# Patient Record
Sex: Female | Born: 1955 | Race: White | Hispanic: No | Marital: Married | Smoking: Current every day smoker
Health system: Southern US, Community
[De-identification: ages and names within clinical notes are randomized; demographics above are authoritative.]

## PROBLEM LIST (undated history)

## (undated) DIAGNOSIS — T4145XA Adverse effect of unspecified anesthetic, initial encounter: Secondary | ICD-10-CM

## (undated) DIAGNOSIS — R200 Anesthesia of skin: Secondary | ICD-10-CM

## (undated) DIAGNOSIS — Z8489 Family history of other specified conditions: Secondary | ICD-10-CM

## (undated) DIAGNOSIS — F32A Depression, unspecified: Secondary | ICD-10-CM

## (undated) DIAGNOSIS — N200 Calculus of kidney: Secondary | ICD-10-CM

## (undated) DIAGNOSIS — F419 Anxiety disorder, unspecified: Secondary | ICD-10-CM

## (undated) DIAGNOSIS — F329 Major depressive disorder, single episode, unspecified: Secondary | ICD-10-CM

## (undated) DIAGNOSIS — I10 Essential (primary) hypertension: Secondary | ICD-10-CM

## (undated) DIAGNOSIS — T8859XA Other complications of anesthesia, initial encounter: Secondary | ICD-10-CM

## (undated) DIAGNOSIS — M1990 Unspecified osteoarthritis, unspecified site: Secondary | ICD-10-CM

## (undated) HISTORY — PX: ABDOMINAL HYSTERECTOMY: SHX81

## (undated) HISTORY — PX: TUBAL LIGATION: SHX77

## (undated) HISTORY — PX: FOOT SURGERY: SHX648

---

## 1997-12-07 ENCOUNTER — Emergency Department (HOSPITAL_COMMUNITY): Admission: EM | Admit: 1997-12-07 | Discharge: 1997-12-07 | Payer: Self-pay | Admitting: Emergency Medicine

## 1998-02-16 ENCOUNTER — Other Ambulatory Visit: Admission: RE | Admit: 1998-02-16 | Discharge: 1998-02-16 | Payer: Self-pay | Admitting: Podiatry

## 1999-09-13 ENCOUNTER — Other Ambulatory Visit: Admission: RE | Admit: 1999-09-13 | Discharge: 1999-09-13 | Payer: Self-pay | Admitting: Obstetrics and Gynecology

## 2004-07-11 ENCOUNTER — Ambulatory Visit: Payer: Self-pay

## 2011-03-12 ENCOUNTER — Other Ambulatory Visit: Payer: Self-pay | Admitting: Internal Medicine

## 2011-03-12 ENCOUNTER — Ambulatory Visit
Admission: RE | Admit: 2011-03-12 | Discharge: 2011-03-12 | Disposition: A | Discharge status: Home / Self Care | Payer: PRIVATE HEALTH INSURANCE | Source: Ambulatory Visit | Attending: Internal Medicine | Admitting: Internal Medicine

## 2011-03-12 DIAGNOSIS — M25559 Pain in unspecified hip: Secondary | ICD-10-CM

## 2011-04-12 ENCOUNTER — Emergency Department (HOSPITAL_COMMUNITY)
Admission: EM | Admit: 2011-04-12 | Discharge: 2011-04-12 | Disposition: A | Discharge status: Home / Self Care | Payer: PRIVATE HEALTH INSURANCE | Attending: Emergency Medicine | Admitting: Emergency Medicine

## 2011-04-12 ENCOUNTER — Emergency Department (HOSPITAL_COMMUNITY): Payer: PRIVATE HEALTH INSURANCE

## 2011-04-12 DIAGNOSIS — I10 Essential (primary) hypertension: Secondary | ICD-10-CM | POA: Insufficient documentation

## 2011-04-12 DIAGNOSIS — M25519 Pain in unspecified shoulder: Secondary | ICD-10-CM | POA: Insufficient documentation

## 2011-10-04 ENCOUNTER — Emergency Department (INDEPENDENT_AMBULATORY_CARE_PROVIDER_SITE_OTHER)
Admission: EM | Admit: 2011-10-04 | Discharge: 2011-10-04 | Disposition: A | Discharge status: Home / Self Care | Payer: PRIVATE HEALTH INSURANCE | Source: Home / Self Care | Attending: Emergency Medicine | Admitting: Emergency Medicine

## 2011-10-04 ENCOUNTER — Encounter (HOSPITAL_COMMUNITY): Payer: Self-pay | Admitting: Emergency Medicine

## 2011-10-04 DIAGNOSIS — K1120 Sialoadenitis, unspecified: Secondary | ICD-10-CM

## 2011-10-04 HISTORY — DX: Essential (primary) hypertension: I10

## 2011-10-04 MED ORDER — PENICILLIN V POTASSIUM 500 MG PO TABS: 500.0000 mg | ORAL_TABLET | Freq: Three times a day (TID) | ORAL | Status: AC

## 2011-10-04 NOTE — ED Provider Notes (Signed)
History     CSN: [id number]  Arrival date & time 10/04/11  0909   First MD Initiated Contact with Patient 10/04/11 [phone]      Chief Complaint  Patient presents with  . Facial Swelling    (Consider location/radiation/quality/duration/timing/severity/associated sxs/prior treatment) HPI Comments: Sudden onset of R jaw swelling radiating to her R ear, " swelling was much worse a couple of hours ago, it seem to be getting better, but i got scare cause i don't know what it was, i have no teeth there and have no mouth pains, or ear infections that i know of"  Patient denies, any local rashes, facial muscular weakness or changes, no numbness or tingling  The history is provided by the patient and a relative.    Past Medical History  Diagnosis Date  . Diabetes mellitus   . Hypertension     Past Surgical History  Procedure Date  . Abdominal hysterectomy   . Foot surgery     No family history on file.  History  Substance Use Topics  . Smoking status: Current Everyday Smoker  . Smokeless tobacco: Not on file  . Alcohol Use: No    OB History    Grav Para Term Preterm Abortions TAB SAB Ect Mult Living                  Review of Systems  Constitutional: Negative for fever, activity change, fatigue and unexpected weight change.  HENT: Positive for ear pain and facial swelling. Negative for hearing loss, congestion, neck pain, neck stiffness and tinnitus.   Eyes: Negative for pain and visual disturbance.  Cardiovascular: Negative for chest pain.  Neurological: Negative for dizziness, facial asymmetry, speech difficulty, weakness, numbness and headaches.    Allergies  Codeine  Home Medications   Current Outpatient Rx  Name Route Sig Dispense Refill  . ATENOLOL 25 MG PO TABS Oral Take 25 mg by mouth daily.    . CHLORTHALIDONE 25 MG PO TABS Oral Take 25 mg by mouth daily.    Marland Kitchen GLIPIZIDE ER 5 MG PO TB24 Oral Take 2.5 mg by mouth daily.    Marland Kitchen HYDROCODONE-ACETAMINOPHEN 5-500  MG PO TABS Oral Take 1 tablet by mouth every 6 (six) hours as needed.    Marland Kitchen PENICILLIN V POTASSIUM 500 MG PO TABS Oral Take 1 tablet (500 mg total) by mouth 3 (three) times daily. 40 tablet 0    BP 137/88  Pulse 61  Temp(Src) 98.3 F (36.8 C) (Oral)  Resp 14  SpO2 98%  Physical Exam  Nursing note and vitals reviewed. Constitutional: She appears well-developed.  Non-toxic appearance. She does not have a sickly appearance. She does not appear ill. No distress.  HENT:  Head: Normocephalic and atraumatic.    Right Ear: Tympanic membrane and external ear normal.  Left Ear: Tympanic membrane and external ear normal.  Mouth/Throat: No oropharyngeal exudate.  Neck: Neck supple. No JVD present. No thyromegaly present.  Lymphadenopathy:    She has no cervical adenopathy.  Skin: No rash noted.    ED Course  Procedures (including critical care time)  Labs Reviewed - No data to display No results found.   1. Sialoadenitis of submandibular gland       MDM  INTERMITTENT SIALOADENITIS LEFT SUBMANDIBULAR GLAND- FOLLOW-UP AND CLOSE MONITORING RECOMMENDED        Jimmie Molly, MD 10/04/11 1757

## 2011-10-04 NOTE — ED Notes (Signed)
PT HERE WITH SUDDEN RIGHT JAW SELLING RADIATING TO R EAR WITH TIGHTNESS BUT NO PAIN OR NUMB/TINGLING.STARTED THIS MORNING WHILE WATCHING TV.PT STATES THE SWELLING GOES UP/DOWN.NO NEW MEDS OR FOODS REPORTED.

## 2011-10-04 NOTE — Discharge Instructions (Signed)
As discussed be alert for any skin changes or further symptoms in the meantime we discuss several things to do along with his antibiotic prescription that is mainly prevent his as is uncertain if this is infectious in nature try to increase her hydration and salivation as discussed.   Sialadenitis Sialadenitis is an inflammation (soreness) of the salivary glands. The parotid is the main salivary gland. It lies behind the angle of the jaw below the ear. The saliva produced comes out of a tiny opening (duct) inside the cheek on either side. This is usually at the level of the upper back teeth. If it is swollen, the ear is pushed up and out. This helps tell this condition apart from a simple lymph gland infection (swollen glands) in the same area. Mumps has mostly disappeared since the start of immunization against mumps. Now the most common cause of parotitis is germ (bacterial) infection or inflammation of the lymphatics (the lymph channels). The other major salivary gland is located in the floor of the mouth. Smaller salivary glands are located in the mouth. This includes the:  Lips.   Lining of the mouth.   Pharynx.   Hard palate (front part of the roof of the mouth).  The salivary glands do many things, including:  Lubrication.   Breaking down food.   Production of hormones and antibodies (to protect against germs which may cause illness).   Help with the sense of taste.  ACUTE BACTERIAL SIALADENITIS This is a sudden inflammatory response to bacterial infection. This causes redness, pain, swelling and tenderness over the infected gland. In the past, it was common in dehydrated and debilitated patients often following an operation. It is now more commonly seen:  After radiotherapy.   In patients with poor immune systems.  Treatment is:  The correction of fluid balance (rehydration).   Medicine that kill germs (antibiotics).   Pain relief.  CHRONIC RECURRENT SIALADENITIS This  refers to repeated episodes of discomfort and swelling of one of the salivary glands. It often occurs after eating. Chronic sialadenitis is usually less painful. It is associated with recurrent enlargement of a salivary gland, often following meals, and typically with an absence of redness. The chronic form of the disease often is associated with conditions linked to decreased salivary flow, rather than dehydration (loss of body fluids). These conditions include:  A stone, or concretion, formed in the gallbladder, kidneys, or other parts of the body (calculi).   Salivary stasis.   A change in the fluid and electrolyte (the salts in your body fluids) makeup of the gland.  It is treated with:  Gland massage.   Methods to stimulate the flow of saliva, (for example, lemon juice).   Antibiotics if required.  Surgery to remove the gland is possible, but its benefits need to be balanced against risks.  VIRAL SIALADENITIS Several viruses infect the salivary glands. Some of these include the mumps virus that commonly infects the parotid gland. Other viruses causing problems are:  The HIV virus.   Herpes.   Some of the influenza ("flu") viruses.  RECURRENT SIALADENITIS IN CHILDREN This condition is thought to be due to swelling or ballooning of the ducts. It results in the same symptoms as acute bacterial parotitis. It is usually caused by germs (bacteria). It is often treated using penicillin. It may get well without treatment. Surgery is usually not required. TUBERCULOUS SIALADENITIS The salivary glands may become infected with the same bacteria causing tuberculosis ("TB"). Treatment is with anti-tuberculous  antibiotic therapy. OTHER UNCOMMON CAUSES OF SIALADENITIS   Sjogren's syndrome is a condition in which arthritis is associated with a decrease in activity of the glands of the body that produce saliva and tears. The diagnosis is made with blood tests or by examination of a piece of tissue  from the inside of the lip. Some people with this condition are bothered by:   A dry mouth.   Intermittent salivary gland enlargement.   Atypical mycobacteria is a germ similar to tuberculosis. It often infects children. It is often resistant to antibiotic treatment. It may require surgical treatment to remove the infected salivary gland.   Actinomycosis is an infection of the parotid gland that may also involve the overlying skin. The diagnosis is made by detecting granules of sulphur produced by the bacteria on microscopic examination. Treatment is a prolonged course of penicillin for up to one year.   Nutritional causes include vitamin deficiencies and bulimia.   Diabetes and problems with your thyroid.   Obesity, cirrhosis, and malabsorption are some metabolic causes.  HOME CARE INSTRUCTIONS   Apply ice bags every 2 hours for 15 to 20 minutes, while awake, to the sore gland for 24 hours, then as directed by your caregiver. Place the ice in a plastic bag with a towel around it to prevent frostbite to the skin.   Only take over-the-counter or prescription medicines for pain, discomfort, or fever as directed by your caregiver.  SEEK IMMEDIATE MEDICAL CARE IF:   There is increased pain or swelling in your gland that is not controlled with medicine.   An oral temperature above 102 F (38.9 C) develops, not controlled by medicine.   You develop difficulty opening your mouth, swallowing, or speaking.  Document Released: 11/23/2001 Document Revised: 05/23/2011 Document Reviewed: 01/18/2008 [facility] Patient Information 2012 [place], [place].

## 2011-12-22 ENCOUNTER — Emergency Department (HOSPITAL_COMMUNITY)
Admission: EM | Admit: 2011-12-22 | Discharge: 2011-12-22 | Discharge status: Against Medical Advice | Payer: Self-pay | Attending: Emergency Medicine | Admitting: Emergency Medicine

## 2011-12-22 ENCOUNTER — Encounter (HOSPITAL_COMMUNITY): Payer: Self-pay | Admitting: Emergency Medicine

## 2011-12-22 DIAGNOSIS — R059 Cough, unspecified: Secondary | ICD-10-CM | POA: Insufficient documentation

## 2011-12-22 DIAGNOSIS — R05 Cough: Secondary | ICD-10-CM | POA: Insufficient documentation

## 2011-12-22 NOTE — ED Notes (Signed)
Pt has decided to leave pt screening by MD

## 2011-12-22 NOTE — ED Notes (Signed)
Pt alert, nad, arrives from home, c/o sore throat and cough, onset several days ago resp even unlabored, skin pwd

## 2012-08-18 IMAGING — CR DG SHOULDER 2+V*L*
3 series · 3 of 3 positions shown · non-contrast
Comparison: None.

CLINICAL DATA: A

LEFT SHOULDER - 2+ VIEW

[w shoulder internal left]
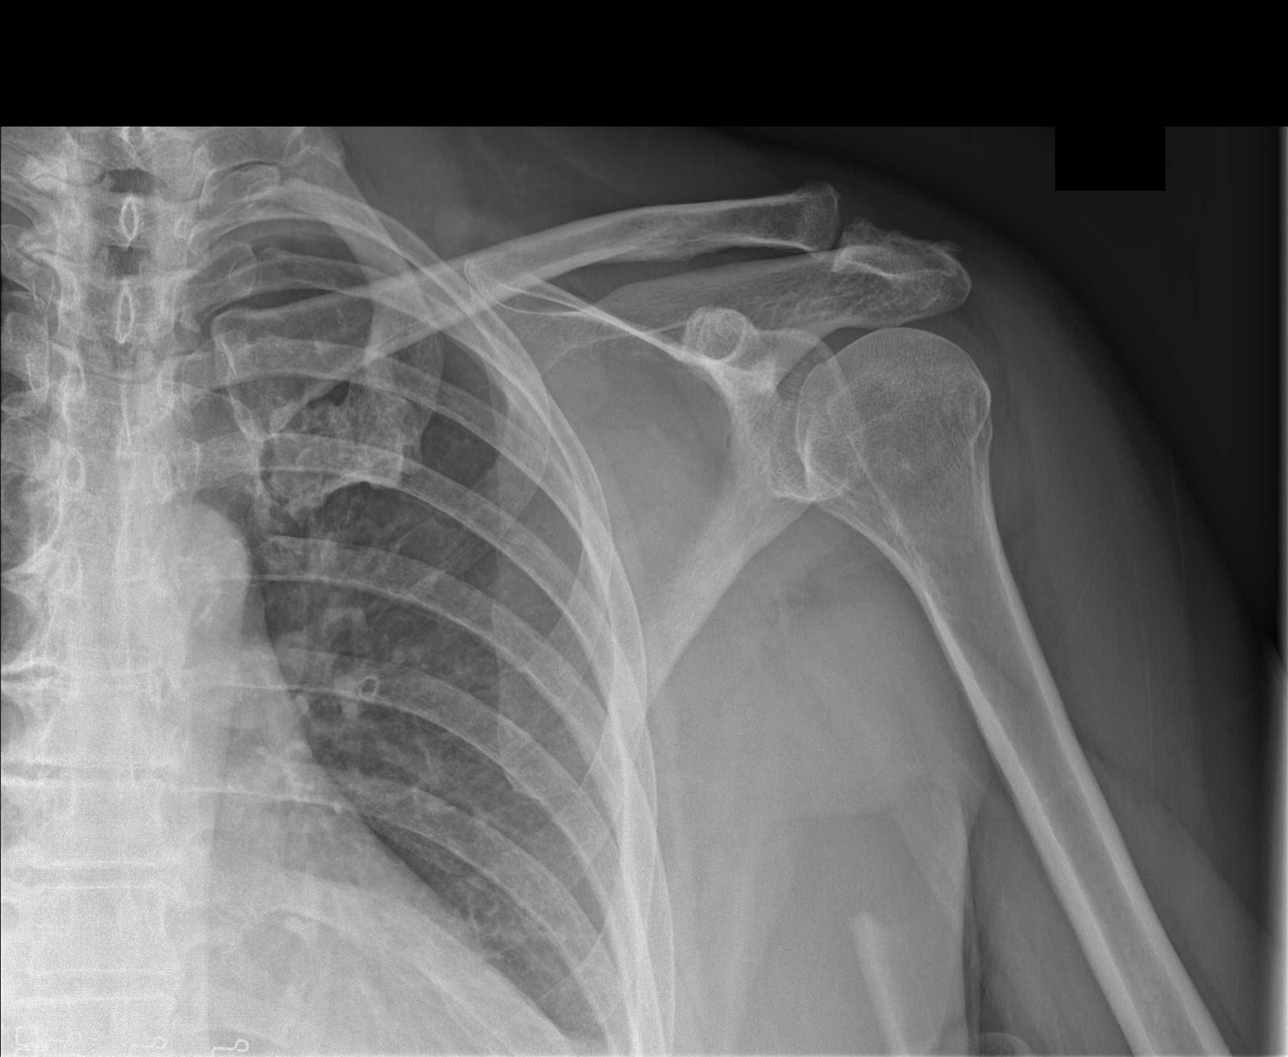

[w shoulder external left]
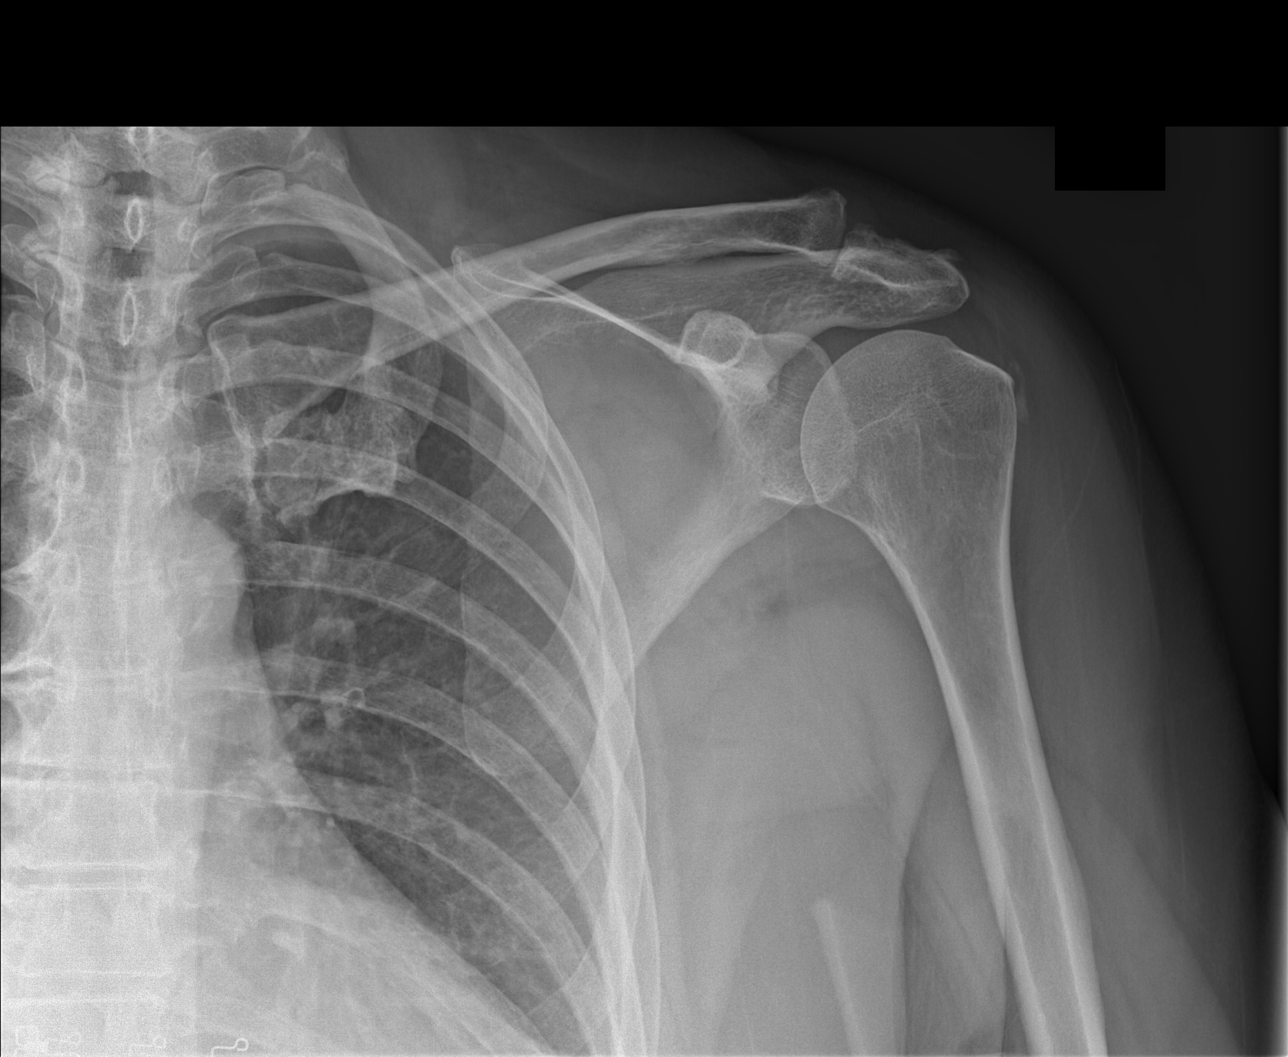

[w shoulder y-view left]
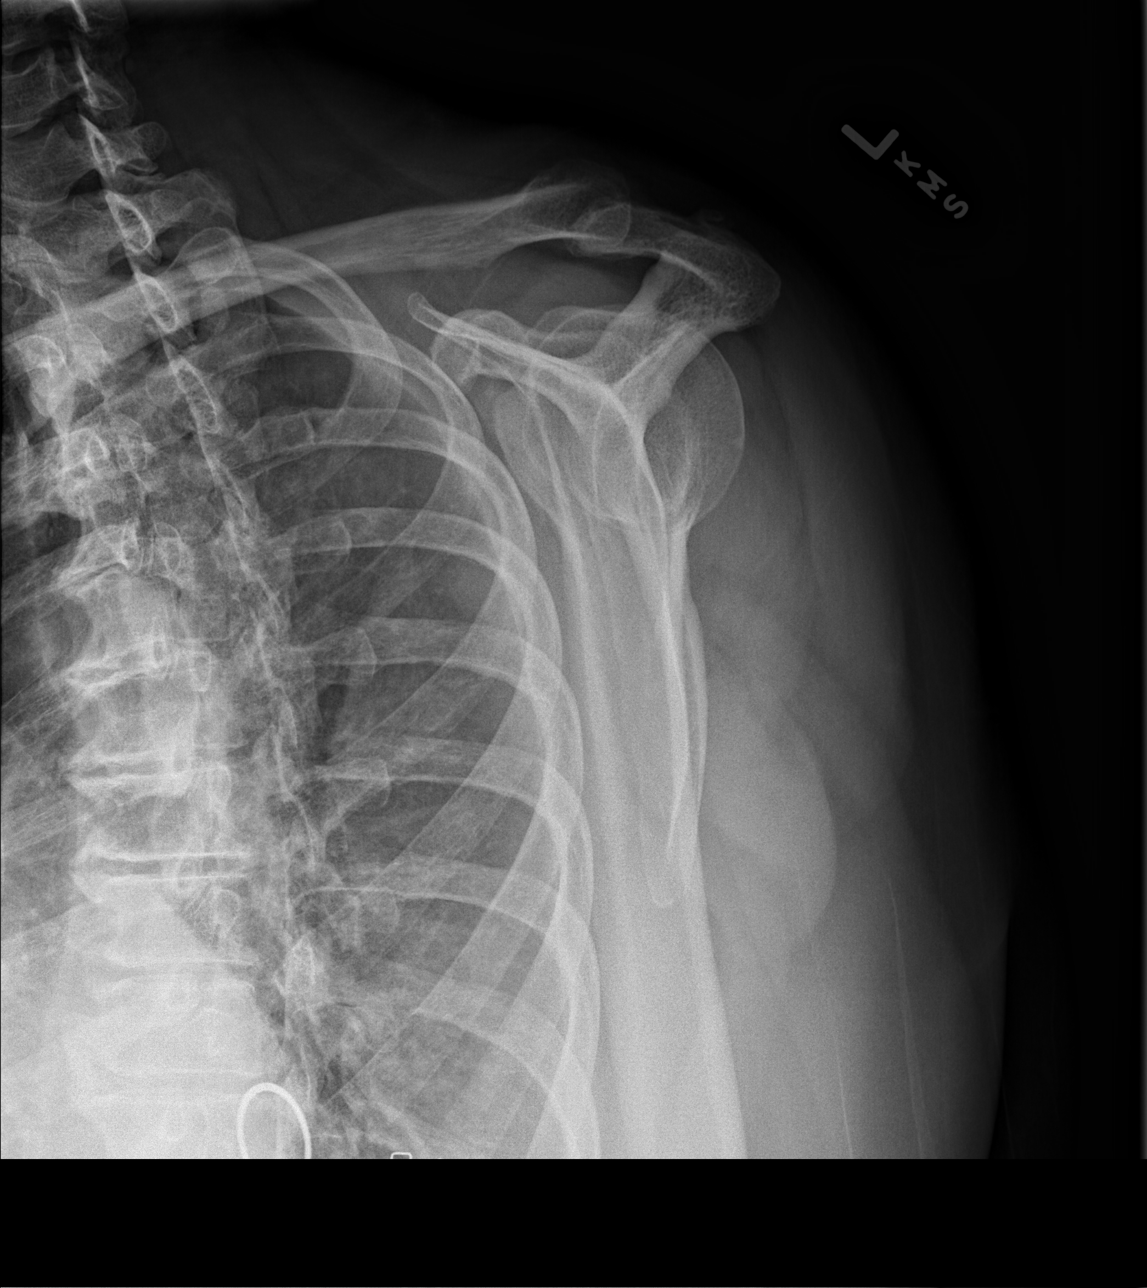

[3 of 3 positions shown; findings below may reference images not displayed]

FINDINGS: No fracture.  No evidence for shoulder separation or
dislocation.  There is some degenerative change at the AC joint.
IMPRESSION: No acute bony findings.

## 2013-05-26 ENCOUNTER — Encounter (INDEPENDENT_AMBULATORY_CARE_PROVIDER_SITE_OTHER): Payer: Self-pay | Admitting: General Surgery

## 2013-05-26 ENCOUNTER — Encounter (INDEPENDENT_AMBULATORY_CARE_PROVIDER_SITE_OTHER): Payer: Self-pay

## 2013-05-26 ENCOUNTER — Ambulatory Visit (INDEPENDENT_AMBULATORY_CARE_PROVIDER_SITE_OTHER): Payer: PRIVATE HEALTH INSURANCE | Admitting: General Surgery

## 2013-05-26 VITALS — BP 121/79 | HR 76 | Temp 98.1°F | Resp 14 | Ht 67.0 in | Wt 208.4 lb

## 2013-05-26 DIAGNOSIS — K8020 Calculus of gallbladder without cholecystitis without obstruction: Secondary | ICD-10-CM

## 2013-05-26 NOTE — Progress Notes (Signed)
Patient ID: Sherri Stanley, female   DOB: [birth year], 58 y.o.   MRN: [id number]  Chief Complaint  Patient presents with  . Abdominal Pain    gallbladder    HPI Sherri Stanley is a 57 y.o. female.  The patient is a 57 year old female with approximately one-week history of epigastric abdominal pain. Patient presented to the [facility] ER and was diagnosed with biliary colic large gallstone in the neck of her gallbladder. Patient states as was her initial presentation his never had pain like this before. She  Has had some small subsequent events since then.  HPI  Past Medical History  Diagnosis Date  . Diabetes mellitus   . Hypertension     Past Surgical History  Procedure Laterality Date  . Abdominal hysterectomy    . Foot surgery      History reviewed. No pertinent family history.  Social History History  Substance Use Topics  . Smoking status: Current Every Day Smoker -- 1.00 packs/day    Types: Cigarettes  . Smokeless tobacco: Not on file  . Alcohol Use: No    Allergies  Allergen Reactions  . Codeine     Current Outpatient Prescriptions  Medication Sig Dispense Refill  . atenolol (TENORMIN) 25 MG tablet Take 25 mg by mouth daily.      . chlorthalidone (HYGROTON) 25 MG tablet Take 25 mg by mouth daily.      Marland Kitchen glipiZIDE (GLUCOTROL XL) 5 MG 24 hr tablet Take 2.5 mg by mouth daily.      Marland Kitchen HYDROcodone-acetaminophen (VICODIN) 5-500 MG per tablet Take 1 tablet by mouth every 6 (six) hours as needed.       No current facility-administered medications for this visit.    Review of Systems Review of Systems  Constitutional: Negative.   HENT: Negative.   Respiratory: Negative.   Cardiovascular: Negative.   Gastrointestinal: Positive for abdominal pain.  Neurological: Negative.   All other systems reviewed and are negative.    Blood pressure 121/79, pulse 76, temperature 98.1 F (36.7 C), temperature source Temporal, resp. rate 14, height 5\' 7"  (1.702 m), weight 208 lb 6.4  oz (94.53 kg).  Physical Exam Physical Exam  Constitutional: She is oriented to person, place, and time. She appears well-developed and well-nourished.  HENT:  Head: Normocephalic and atraumatic.  Eyes: Conjunctivae and EOM are normal. Pupils are equal, round, and reactive to light.  Neck: Normal range of motion. Neck supple.  Cardiovascular: Normal rate, regular rhythm and normal heart sounds.   Pulmonary/Chest: Effort normal and breath sounds normal.  Abdominal: Soft. There is no tenderness. There is no rebound and no guarding.  Musculoskeletal: Normal range of motion.  Neurological: She is alert and oriented to person, place, and time.  Skin: Skin is warm and dry.  Psychiatric: She has a normal mood and affect.    Data Reviewed Ultrasound reveals a large gallstone in the neck of the gallbladder  Assessment    57 year old female with symptomatic cholelithiasis     Plan    1 we will proceed to the operating room for laparoscopic cholecystectomy 2.All risks and benefits were discussed with the patient to generally include: infection, bleeding, possible need for post op ERCP, damage to the bile ducts, and bile leak. Alternatives were offered and described.  All questions were answered and the patient voiced understanding of the procedure and wishes to proceed at this point with a laparoscopic cholecystectomy         Marigene Ehlers., Rashod Gougeon 05/26/2013, 1:25  PM

## 2013-05-27 ENCOUNTER — Encounter (HOSPITAL_COMMUNITY): Payer: Self-pay | Admitting: Pharmacy Technician

## 2013-05-27 NOTE — Pre-Procedure Instructions (Signed)
Sherri Stanley  05/27/2013   Your procedure is scheduled on:  Monday May 31, 2013 @ 12:09 pm.  Report to [facility] Short Stay Entrance "A" Admitting at 10:05 AM.  Call this number if you have problems the morning of surgery: [phone]   Remember:   Do not eat food or drink liquids after midnight.   Take these medicines the morning of surgery with A SIP OF WATER: Atenolol (Tenormin), and Hydrocodone if needed for pain  Do NOT take any diabetic medications the morning of your surgery Stop taking Aspirin, vitamins and herbal medications. Do not take any NSAIDs ie: Ibuprofen, Advil, Naproxen or any medication containing Aspirin.  Do not wear jewelry, make-up or nail polish.  Do not wear lotions, powders, or perfumes. You may wear deodorant.  Do not shave 48 hours prior to surgery.   Do not bring valuables to the hospital.  [facility] is not responsible for any belongings or valuables.               Contacts, dentures or bridgework may not be worn into surgery.  Leave suitcase in the car. After surgery it may be brought to your room.  For patients admitted to the hospital, discharge time is determined by your treatment team.               Patients discharged the day of surgery will not be allowed to drive home.  Name and phone number of your driver: Family/Friend  Special Instructions: Shower using CHG 2 nights before surgery and the night before surgery.  If you shower the day of surgery use CHG.  Use special wash - you have one bottle of CHG for all showers.  You should use approximately 1/3 of the bottle for each shower.   Please read over the following fact sheets that you were given: Pain Booklet, Coughing and Deep Breathing and Surgical Site Infection Prevention

## 2013-05-28 ENCOUNTER — Encounter (HOSPITAL_COMMUNITY)
Admission: RE | Admit: 2013-05-28 | Discharge: 2013-05-28 | Disposition: A | Discharge status: Home / Self Care | Payer: PRIVATE HEALTH INSURANCE | Source: Ambulatory Visit | Attending: General Surgery | Admitting: General Surgery

## 2013-05-28 ENCOUNTER — Encounter (HOSPITAL_COMMUNITY): Payer: Self-pay

## 2013-05-28 HISTORY — DX: Depression, unspecified: F32.A

## 2013-05-28 HISTORY — DX: Major depressive disorder, single episode, unspecified: F32.9

## 2013-05-28 HISTORY — DX: Adverse effect of unspecified anesthetic, initial encounter: T41.45XA

## 2013-05-28 HISTORY — DX: Family history of other specified conditions: Z84.89

## 2013-05-28 HISTORY — DX: Anesthesia of skin: R20.0

## 2013-05-28 HISTORY — DX: Unspecified osteoarthritis, unspecified site: M19.90

## 2013-05-28 HISTORY — DX: Other complications of anesthesia, initial encounter: T88.59XA

## 2013-05-28 HISTORY — DX: Anxiety disorder, unspecified: F41.9

## 2013-05-28 HISTORY — DX: Calculus of kidney: N20.0

## 2013-05-28 LAB — CBC
MCHC: 34.9 g/dL (ref 30.0–36.0)
Platelets: 226 10*3/uL (ref 150–400)
RDW: 13.3 % (ref 11.5–15.5)

## 2013-05-28 LAB — BASIC METABOLIC PANEL
GFR calc Af Amer: >90 mL/min (ref >90)
GFR calc non Af Amer: >90 mL/min (ref >90)
Potassium: 3.2 mEq/L — ABNORMAL LOW (ref 3.5–5.1)
Sodium: 140 mEq/L (ref 135–145)

## 2013-05-30 MED ORDER — CEFAZOLIN SODIUM-DEXTROSE 2-3 GM-% IV SOLR
2.0000 g | INTRAVENOUS | Status: AC
  Administered 2013-05-31: 2 g via INTRAVENOUS
  Filled 2013-05-30: qty 50

## 2013-05-31 ENCOUNTER — Encounter (HOSPITAL_COMMUNITY): Payer: Self-pay | Admitting: Anesthesiology

## 2013-05-31 ENCOUNTER — Encounter (HOSPITAL_COMMUNITY): Payer: PRIVATE HEALTH INSURANCE | Admitting: Anesthesiology

## 2013-05-31 ENCOUNTER — Encounter (HOSPITAL_COMMUNITY)
Admission: RE | Disposition: A | Discharge status: Home / Self Care | Payer: Self-pay | Source: Ambulatory Visit | Attending: General Surgery

## 2013-05-31 ENCOUNTER — Ambulatory Visit (HOSPITAL_COMMUNITY)
Admission: RE | Admit: 2013-05-31 | Discharge: 2013-05-31 | Disposition: A | Discharge status: Home / Self Care | Payer: PRIVATE HEALTH INSURANCE | Source: Ambulatory Visit | Attending: General Surgery | Admitting: General Surgery

## 2013-05-31 ENCOUNTER — Ambulatory Visit (HOSPITAL_COMMUNITY): Payer: PRIVATE HEALTH INSURANCE | Admitting: Anesthesiology

## 2013-05-31 DIAGNOSIS — K8010 Calculus of gallbladder with chronic cholecystitis without obstruction: Secondary | ICD-10-CM

## 2013-05-31 DIAGNOSIS — K8020 Calculus of gallbladder without cholecystitis without obstruction: Secondary | ICD-10-CM | POA: Insufficient documentation

## 2013-05-31 DIAGNOSIS — E119 Type 2 diabetes mellitus without complications: Secondary | ICD-10-CM | POA: Insufficient documentation

## 2013-05-31 DIAGNOSIS — F17200 Nicotine dependence, unspecified, uncomplicated: Secondary | ICD-10-CM | POA: Insufficient documentation

## 2013-05-31 DIAGNOSIS — Z79899 Other long term (current) drug therapy: Secondary | ICD-10-CM | POA: Insufficient documentation

## 2013-05-31 DIAGNOSIS — I10 Essential (primary) hypertension: Secondary | ICD-10-CM | POA: Insufficient documentation

## 2013-05-31 HISTORY — PX: CHOLECYSTECTOMY: SHX55

## 2013-05-31 LAB — GLUCOSE, CAPILLARY: Glucose-Capillary: 137 mg/dL — ABNORMAL HIGH (ref 70–99)

## 2013-05-31 SURGERY — LAPAROSCOPIC CHOLECYSTECTOMY
Anesthesia: General | Site: Abdomen

## 2013-05-31 MED ORDER — BUPIVACAINE HCL (PF) 0.25 % IJ SOLN
INTRAMUSCULAR | Status: AC
  Filled 2013-05-31: qty 30

## 2013-05-31 MED ORDER — LACTATED RINGERS IV SOLN
Freq: Once | INTRAVENOUS | Status: AC
  Administered 2013-05-31: 10:00:00 via INTRAVENOUS

## 2013-05-31 MED ORDER — PROPOFOL 10 MG/ML IV BOLUS
INTRAVENOUS | Status: DC | PRN
  Administered 2013-05-31: 150 mg via INTRAVENOUS

## 2013-05-31 MED ORDER — SODIUM CHLORIDE 0.9 % IR SOLN
Status: DC | PRN
  Administered 2013-05-31: 1000 mL

## 2013-05-31 MED ORDER — SODIUM CHLORIDE 0.9 % IV SOLN: 250.0000 mL | INTRAVENOUS | Status: DC | PRN

## 2013-05-31 MED ORDER — SODIUM CHLORIDE 0.9 % IJ SOLN: 3.0000 mL | Freq: Two times a day (BID) | INTRAMUSCULAR | Status: DC

## 2013-05-31 MED ORDER — LACTATED RINGERS IV SOLN
INTRAVENOUS | Status: DC | PRN
  Administered 2013-05-31 (×2): via INTRAVENOUS

## 2013-05-31 MED ORDER — SODIUM CHLORIDE 0.9 % IJ SOLN: 3.0000 mL | INTRAMUSCULAR | Status: DC | PRN

## 2013-05-31 MED ORDER — ARTIFICIAL TEARS OP OINT
TOPICAL_OINTMENT | OPHTHALMIC | Status: DC | PRN
  Administered 2013-05-31: 1 via OPHTHALMIC

## 2013-05-31 MED ORDER — ROCURONIUM BROMIDE 100 MG/10ML IV SOLN
INTRAVENOUS | Status: DC | PRN
  Administered 2013-05-31: 40 mg via INTRAVENOUS

## 2013-05-31 MED ORDER — HYDROMORPHONE HCL PF 1 MG/ML IJ SOLN
INTRAMUSCULAR | Status: AC
  Filled 2013-05-31: qty 1

## 2013-05-31 MED ORDER — TRAMADOL HCL 50 MG PO TABS: 50.0000 mg | ORAL_TABLET | Freq: Four times a day (QID) | ORAL | Status: DC | PRN

## 2013-05-31 MED ORDER — GLYCOPYRROLATE 0.2 MG/ML IJ SOLN
INTRAMUSCULAR | Status: DC | PRN
  Administered 2013-05-31: .6 mg via INTRAVENOUS

## 2013-05-31 MED ORDER — LIDOCAINE HCL (CARDIAC) 20 MG/ML IV SOLN
INTRAVENOUS | Status: DC | PRN
  Administered 2013-05-31: 100 mg via INTRAVENOUS

## 2013-05-31 MED ORDER — ACETAMINOPHEN 325 MG PO TABS: 650.0000 mg | ORAL_TABLET | ORAL | Status: DC | PRN

## 2013-05-31 MED ORDER — ONDANSETRON HCL 4 MG/2ML IJ SOLN: 4.0000 mg | Freq: Once | INTRAMUSCULAR | Status: DC | PRN

## 2013-05-31 MED ORDER — HYDROMORPHONE HCL PF 1 MG/ML IJ SOLN
0.2500 mg | INTRAMUSCULAR | Status: DC | PRN
  Administered 2013-05-31 (×2): 0.5 mg via INTRAVENOUS

## 2013-05-31 MED ORDER — ONDANSETRON HCL 4 MG/2ML IJ SOLN: 4.0000 mg | Freq: Four times a day (QID) | INTRAMUSCULAR | Status: DC | PRN

## 2013-05-31 MED ORDER — CHLORHEXIDINE GLUCONATE 4 % EX LIQD: 1.0000 "application " | Freq: Once | CUTANEOUS | Status: DC

## 2013-05-31 MED ORDER — MIDAZOLAM HCL 5 MG/5ML IJ SOLN
INTRAMUSCULAR | Status: DC | PRN
  Administered 2013-05-31: 2 mg via INTRAVENOUS

## 2013-05-31 MED ORDER — ONDANSETRON HCL 4 MG/2ML IJ SOLN
INTRAMUSCULAR | Status: DC | PRN
  Administered 2013-05-31 (×2): 4 mg via INTRAVENOUS

## 2013-05-31 MED ORDER — NEOSTIGMINE METHYLSULFATE 1 MG/ML IJ SOLN
INTRAMUSCULAR | Status: DC | PRN
  Administered 2013-05-31: 5 mg via INTRAVENOUS

## 2013-05-31 MED ORDER — ATROPINE SULFATE 0.1 MG/ML IJ SOLN
INTRAMUSCULAR | Status: AC
  Administered 2013-05-31: 0.4 mg
  Filled 2013-05-31: qty 10

## 2013-05-31 MED ORDER — BUPIVACAINE HCL 0.25 % IJ SOLN
INTRAMUSCULAR | Status: DC | PRN
  Administered 2013-05-31: 12 mL

## 2013-05-31 MED ORDER — ACETAMINOPHEN 650 MG RE SUPP: 650.0000 mg | RECTAL | Status: DC | PRN

## 2013-05-31 MED ORDER — FENTANYL CITRATE 0.05 MG/ML IJ SOLN
INTRAMUSCULAR | Status: DC | PRN
  Administered 2013-05-31: 50 ug via INTRAVENOUS
  Administered 2013-05-31: 100 ug via INTRAVENOUS
  Administered 2013-05-31: 50 ug via INTRAVENOUS

## 2013-05-31 MED ORDER — 0.9 % SODIUM CHLORIDE (POUR BTL) OPTIME
TOPICAL | Status: DC | PRN
  Administered 2013-05-31: 1000 mL

## 2013-05-31 SURGICAL SUPPLY — 43 items
APL SKNCLS STERI-STRIP NONHPOA (GAUZE/BANDAGES/DRESSINGS) ×1
BAG SPEC RTRVL LRG 6X4 10 (ENDOMECHANICALS)
BENZOIN TINCTURE PRP APPL 2/3 (GAUZE/BANDAGES/DRESSINGS) ×2 IMPLANT
CANISTER SUCTION 2500CC (MISCELLANEOUS) ×2 IMPLANT
CHLORAPREP W/TINT 26ML (MISCELLANEOUS) ×2 IMPLANT
CLIP LIGATING HEMO O LOK GREEN (MISCELLANEOUS) ×2 IMPLANT
COVER MAYO STAND STRL (DRAPES) IMPLANT
COVER SURGICAL LIGHT HANDLE (MISCELLANEOUS) ×2 IMPLANT
COVER TRANSDUCER ULTRASND (DRAPES) ×2 IMPLANT
DEVICE TROCAR PUNCTURE CLOSURE (ENDOMECHANICALS) ×2 IMPLANT
DRAPE C-ARM 42X72 X-RAY (DRAPES) IMPLANT
DRAPE UTILITY 15X26 W/TAPE STR (DRAPE) ×4 IMPLANT
ELECT REM PT RETURN 9FT ADLT (ELECTROSURGICAL) ×2
ELECTRODE REM PT RTRN 9FT ADLT (ELECTROSURGICAL) ×1 IMPLANT
GAUZE SPONGE 2X2 8PLY STRL LF (GAUZE/BANDAGES/DRESSINGS) ×1 IMPLANT
GLOVE BIO SURGEON STRL SZ7 (GLOVE) ×1 IMPLANT
GLOVE BIO SURGEON STRL SZ7.5 (GLOVE) ×2 IMPLANT
GLOVE BIOGEL PI IND STRL 7.0 (GLOVE) IMPLANT
GLOVE BIOGEL PI INDICATOR 7.0 (GLOVE) ×3
GLOVE SURG SS PI 7.0 STRL IVOR (GLOVE) ×1 IMPLANT
GOWN STRL NON-REIN LRG LVL3 (GOWN DISPOSABLE) ×5 IMPLANT
GOWN STRL REIN XL XLG (GOWN DISPOSABLE) ×2 IMPLANT
IV CATH 14GX2 1/4 (CATHETERS) IMPLANT
KIT BASIN OR (CUSTOM PROCEDURE TRAY) ×2 IMPLANT
KIT ROOM TURNOVER OR (KITS) ×2 IMPLANT
NDL INSUFFLATION 14GA 120MM (NEEDLE) ×1 IMPLANT
NEEDLE INSUFFLATION 14GA 120MM (NEEDLE) ×2 IMPLANT
NS IRRIG 1000ML POUR BTL (IV SOLUTION) ×2 IMPLANT
PAD ARMBOARD 7.5X6 YLW CONV (MISCELLANEOUS) ×4 IMPLANT
POUCH SPECIMEN RETRIEVAL 10MM (ENDOMECHANICALS) IMPLANT
SCISSORS LAP 5X35 DISP (ENDOMECHANICALS) ×2 IMPLANT
SET CHOLANGIOGRAPHY FRANKLIN (SET/KITS/TRAYS/PACK) IMPLANT
SET IRRIG TUBING LAPAROSCOPIC (IRRIGATION / IRRIGATOR) ×2 IMPLANT
SLEEVE ENDOPATH XCEL 5M (ENDOMECHANICALS) ×2 IMPLANT
SPECIMEN JAR SMALL (MISCELLANEOUS) ×2 IMPLANT
SPONGE GAUZE 2X2 STER 10/PKG (GAUZE/BANDAGES/DRESSINGS) ×1
SUT MNCRL AB 3-0 PS2 18 (SUTURE) ×2 IMPLANT
TAPE CLOTH SURG 4X10 WHT LF (GAUZE/BANDAGES/DRESSINGS) ×1 IMPLANT
TOWEL OR 17X24 6PK STRL BLUE (TOWEL DISPOSABLE) ×2 IMPLANT
TOWEL OR 17X26 10 PK STRL BLUE (TOWEL DISPOSABLE) ×2 IMPLANT
TRAY LAPAROSCOPIC (CUSTOM PROCEDURE TRAY) ×2 IMPLANT
TROCAR XCEL NON-BLD 11X100MML (ENDOMECHANICALS) ×2 IMPLANT
TROCAR XCEL NON-BLD 5MMX100MML (ENDOMECHANICALS) ×2 IMPLANT

## 2013-05-31 NOTE — Anesthesia Procedure Notes (Signed)
Procedure Name: Intubation Date/Time: 05/31/2013 12:36 PM Performed by: Sherie Don Pre-anesthesia Checklist: Patient identified, Emergency Drugs available, Suction available, Patient being monitored and Timeout performed Patient Re-evaluated:Patient Re-evaluated prior to inductionOxygen Delivery Method: Circle system utilized Preoxygenation: Pre-oxygenation with 100% oxygen Intubation Type: IV induction Ventilation: Mask ventilation without difficulty Laryngoscope Size: Mac and 3 Grade View: Grade I Tube type: Oral Tube size: 7.5 mm Number of attempts: 1 Airway Equipment and Method: Stylet Placement Confirmation: positive ETCO2,  ETT inserted through vocal cords under direct vision and breath sounds checked- equal and bilateral Secured at: 22 cm Tube secured with: Tape Dental Injury: Teeth and Oropharynx as per pre-operative assessment

## 2013-05-31 NOTE — Op Note (Signed)
05/31/2013  1:21 PM  PATIENT:  Sherri Stanley  57 y.o. female  PRE-OPERATIVE DIAGNOSIS:  gallstones  POST-OPERATIVE DIAGNOSIS:  gallstones  PROCEDURE:  Procedure(s): LAPAROSCOPIC CHOLECYSTECTOMY (N/A)  SURGEON:  Surgeon(s) and Role:    * Axel Filler, MD - Primary  PHYSICIAN ASSISTANT:   ASSISTANTS: none   ANESTHESIA:   general  EBL:  Total I/O In: 1000 [I.V.:1000] Out: -   BLOOD ADMINISTERED:none  DRAINS: none   LOCAL MEDICATIONS USED:  MARCAINE     SPECIMEN:  Source of Specimen:  Gallbladder   DISPOSITION OF SPECIMEN:  PATHOLOGY  COUNTS:  YES  TOURNIQUET:  * No tourniquets in log *  DICTATION: .Dragon Dictation Findings:Chornically inflammed gallbladder  Indications for procedure: Pt is a 57 y/o F with RUQ pain and seen to have gallstones. She was counseled in clinic and decided to have these elecitvely repaired.  Details of the procedure: The patient was taken to the operating and placed in the supine position with bilateral SCDs in place. A time out was called and all facts were verified. A pneumoperitoneum was obtained via A Veress needle technique to a pressure of 14mm of mercury. A 5mm trochar was then placed in the right upper quadrant under visualization, and there were no injuries to any abdominal organs. A 11 mm port was then placed in the umbilical region after infiltrating with local anesthesia under direct visualization. A second epigastric port was placed under direct visualization. The gallbladder was identified and retracted, the peritoneum was then sharply dissected from the gallbladder and this dissection was carried down to Calot's triangle. The cystic duct was identified and stripped away circumferentially and seen going into the gallbladder 360, the critical angle was obtained. It was noted to be very dilated and large.  2 clips were placed proximally one distally and the cystic duct transected. The cystic artery was identified and 2 clips  placed proximally and one distally and transected. We then proceeded to remove the gallbladder off the hepatic fossa with Bovie cautery. A retrieval bag was then placed in the abdomen and gallbladder placed in the bag. The hepatic fossa was then reexamined and hemostasis was achieved with Bovie cautery and was excellent at this portion of the case. The subhepatic fossa and perihepatic fossa was then irrigated until the effluent was clear. The 11 mm trocar fascia was reapproximated with the Endo Close #1 Vicryl x2. The pneumoperitoneum was evacuated and all trochars removed under direct visulalization. The skin was then closed with 4-0 Monocryl and the skin dressed with Steri-Strips, gauze, and tape. The patient was awaken from general anesthesia and taken to the recovery room in stable condition.    PLAN OF CARE: Discharge to home after PACU  PATIENT DISPOSITION:  PACU - hemodynamically stable.   Delay start of Pharmacological VTE agent (>24hrs) due to surgical blood loss or risk of bleeding: not applicable

## 2013-05-31 NOTE — Anesthesia Preprocedure Evaluation (Signed)
Anesthesia Evaluation  Patient identified by MRN, date of birth, ID band Patient awake    Reviewed: Allergy & Precautions, H&P , NPO status , Patient's Chart, lab work & pertinent test results  History of Anesthesia Complications (+) Family history of anesthesia reaction  Airway       Dental   Pulmonary Current Smoker,          Cardiovascular hypertension,     Neuro/Psych    GI/Hepatic   Endo/Other  diabetes, Type 2, Oral Hypoglycemic Agents  Renal/GU Renal disease     Musculoskeletal   Abdominal   Peds  Hematology   Anesthesia Other Findings   Reproductive/Obstetrics                           Anesthesia Physical Anesthesia Plan  ASA: III  Anesthesia Plan: General   Post-op Pain Management:    Induction: Intravenous  Airway Management Planned: Oral ETT  Additional Equipment:   Intra-op Plan:   Post-operative Plan: Extubation in OR  Informed Consent: I have reviewed the patients History and Physical, chart, labs and discussed the procedure including the risks, benefits and alternatives for the proposed anesthesia with the patient or authorized representative who has indicated his/her understanding and acceptance.     Plan Discussed with:   Anesthesia Plan Comments:         Anesthesia Quick Evaluation

## 2013-05-31 NOTE — Progress Notes (Signed)
Dr Gelene Mink aware of pt's HR in pacu. Order received to give atropine .4 mg.  He will see pt in pacu.

## 2013-05-31 NOTE — Interval H&P Note (Signed)
History and Physical Interval Note:  05/31/2013 12:22 PM  Sherri Stanley  has presented today for surgery, with the diagnosis of gallstones  The various methods of treatment have been discussed with the patient and family. After consideration of risks, benefits and other options for treatment, the patient has consented to  Procedure(s): LAPAROSCOPIC CHOLECYSTECTOMY (N/A) as a surgical intervention .  The patient's history has been reviewed, patient examined, no change in status, stable for surgery.  I have reviewed the patient's chart and labs.  Questions were answered to the patient's satisfaction.     Marigene Ehlers., Jed Limerick

## 2013-05-31 NOTE — Preoperative (Signed)
Beta Blockers   Reason not to administer Beta Blockers:received atenolol this am

## 2013-05-31 NOTE — Anesthesia Postprocedure Evaluation (Signed)
Anesthesia Post Note  Patient: Sherri Stanley  Procedure(s) Performed: Procedure(s) (LRB): LAPAROSCOPIC CHOLECYSTECTOMY (N/A)  Anesthesia type: General  Patient location: PACU  Post pain: Pain level controlled  Post assessment: Patient's Cardiovascular Status Stable  Last Vitals:  Filed Vitals:   05/31/13 1517  BP:   Pulse: 63  Temp:   Resp: 18    Post vital signs: Reviewed and stable  Level of consciousness: alert  Complications: No apparent anesthesia complications

## 2013-05-31 NOTE — H&P (View-Only) (Signed)
Patient ID: Sherri Stanley, female   DOB: 01/21/1956, 57 y.o.   MRN: [id number]  Chief Complaint  Patient presents with  . Abdominal Pain    gallbladder    HPI Sherri Stanley is a 57 y.o. female.  The patient is a 57-year-old female with approximately one-week history of epigastric abdominal pain. Patient presented to the [facility] and was diagnosed with biliary colic large gallstone in the neck of her gallbladder. Patient states as was her initial presentation his never had pain like this before. She  Has had some small subsequent events since then.  HPI  Past Medical History  Diagnosis Date  . Diabetes mellitus   . Hypertension     Past Surgical History  Procedure Laterality Date  . Abdominal hysterectomy    . Foot surgery      History reviewed. No pertinent family history.  Social History History  Substance Use Topics  . Smoking status: Current Every Day Smoker -- 1.00 packs/day    Types: Cigarettes  . Smokeless tobacco: Not on file  . Alcohol Use: No    Allergies  Allergen Reactions  . Codeine     Current Outpatient Prescriptions  Medication Sig Dispense Refill  . atenolol (TENORMIN) 25 MG tablet Take 25 mg by mouth daily.      . chlorthalidone (HYGROTON) 25 MG tablet Take 25 mg by mouth daily.      . glipiZIDE (GLUCOTROL XL) 5 MG 24 hr tablet Take 2.5 mg by mouth daily.      . HYDROcodone-acetaminophen (VICODIN) 5-500 MG per tablet Take 1 tablet by mouth every 6 (six) hours as needed.       No current facility-administered medications for this visit.    Review of Systems Review of Systems  Constitutional: Negative.   HENT: Negative.   Respiratory: Negative.   Cardiovascular: Negative.   Gastrointestinal: Positive for abdominal pain.  Neurological: Negative.   All other systems reviewed and are negative.    Blood pressure 121/79, pulse 76, temperature 98.1 F (36.7 C), temperature source Temporal, resp. rate 14, height 5' 7" (1.702 m), weight 208 lb 6.4  oz (94.53 kg).  Physical Exam Physical Exam  Constitutional: She is oriented to person, place, and time. She appears well-developed and well-nourished.  HENT:  Head: Normocephalic and atraumatic.  Eyes: Conjunctivae and EOM are normal. Pupils are equal, round, and reactive to light.  Neck: Normal range of motion. Neck supple.  Cardiovascular: Normal rate, regular rhythm and normal heart sounds.   Pulmonary/Chest: Effort normal and breath sounds normal.  Abdominal: Soft. There is no tenderness. There is no rebound and no guarding.  Musculoskeletal: Normal range of motion.  Neurological: She is alert and oriented to person, place, and time.  Skin: Skin is warm and dry.  Psychiatric: She has a normal mood and affect.    Data Reviewed Ultrasound reveals a large gallstone in the neck of the gallbladder  Assessment    57-year-old female with symptomatic cholelithiasis     Plan    1 we will proceed to the operating room for laparoscopic cholecystectomy 2.All risks and benefits were discussed with the patient to generally include: infection, bleeding, possible need for post op ERCP, damage to the bile ducts, and bile leak. Alternatives were offered and described.  All questions were answered and the patient voiced understanding of the procedure and wishes to proceed at this point with a laparoscopic cholecystectomy         Sherri Beedle Jr., Sherri Stanley 05/26/2013, 1:25   PM    

## 2013-05-31 NOTE — Transfer of Care (Signed)
Immediate Anesthesia Transfer of Care Note  Patient: Sherri Stanley  Procedure(s) Performed: Procedure(s): LAPAROSCOPIC CHOLECYSTECTOMY (N/A)  Patient Location: PACU  Anesthesia Type:General  Level of Consciousness: sedated and patient cooperative  Airway & Oxygen Therapy: Patient Spontanous Breathing and Patient connected to face mask oxygen  Post-op Assessment: Report given to PACU RN, Post -op Vital signs reviewed and stable and Patient moving all extremities X 4  Post vital signs: Reviewed and stable  Complications: No apparent anesthesia complications

## 2013-06-01 ENCOUNTER — Encounter (INDEPENDENT_AMBULATORY_CARE_PROVIDER_SITE_OTHER): Payer: Self-pay

## 2013-06-01 ENCOUNTER — Encounter (HOSPITAL_COMMUNITY): Payer: Self-pay | Admitting: General Surgery

## 2013-06-01 NOTE — Progress Notes (Signed)
Pt fell asleep and sats decreased to 79-83% on R/A.  Pt would arouse to voice but every time she fell asleep, her sats decreased to 79-83%.  Pt placed on 2L O2 by [place]. Sats increased to 98%.

## 2013-06-01 NOTE — Progress Notes (Signed)
Pt much more alert now and back to her baseline.  Ambulated to BR.  Voided large amt.  Discharge teaching completed with pt and husband who verbalize understanding.

## 2013-06-03 ENCOUNTER — Telehealth (INDEPENDENT_AMBULATORY_CARE_PROVIDER_SITE_OTHER): Payer: Self-pay | Admitting: *Deleted

## 2013-06-03 NOTE — Telephone Encounter (Signed)
Pts husband called stating that pt is constipated and it is causing some nausea.  I recommended that pt take some miralax to get some relief.  I also recommended that pt take stool softeners while taking the pain medication.  I explained that if pt continues to have nausea and no bowel movement after intervention to please call our office back.  He is agreeable with this plan.

## 2013-07-05 ENCOUNTER — Encounter (INDEPENDENT_AMBULATORY_CARE_PROVIDER_SITE_OTHER): Payer: PRIVATE HEALTH INSURANCE | Admitting: General Surgery

## 2013-07-08 ENCOUNTER — Encounter (INDEPENDENT_AMBULATORY_CARE_PROVIDER_SITE_OTHER): Payer: Self-pay | Admitting: General Surgery

## 2013-07-08 ENCOUNTER — Encounter (INDEPENDENT_AMBULATORY_CARE_PROVIDER_SITE_OTHER): Payer: Self-pay

## 2013-07-08 ENCOUNTER — Ambulatory Visit (INDEPENDENT_AMBULATORY_CARE_PROVIDER_SITE_OTHER): Payer: PRIVATE HEALTH INSURANCE | Admitting: General Surgery

## 2013-07-08 VITALS — BP 116/70 | HR 68 | Temp 97.8°F | Resp 14 | Ht 67.0 in | Wt 207.8 lb

## 2013-07-08 DIAGNOSIS — Z9889 Other specified postprocedural states: Secondary | ICD-10-CM

## 2013-07-08 NOTE — Progress Notes (Signed)
Patient ID: Sherri SharpsCathey G Clawson, female   DOB: 03/29/1956, 58 y.o.   MRN: [id number] Post op course The patient has been doing well postoperatively. Patient had no complaints with dietary or bowel issues.  On Exam: Her wounds are clean dry and well healed.  Pathology:   Chronic cholecystitis, cholelithiasis present.  This was discussed with the patient.  Assessment and Plan 58 year old female status post laparoscopic cholecystectomy 1. The patient  Can return as needed 2. We discussed in the thigh   Axel FillerArmando Lorin Hauck, MD [facility] General & Minimally Invasive Surgery Trauma & Emergency Surgery

## 2013-08-03 ENCOUNTER — Encounter (INDEPENDENT_AMBULATORY_CARE_PROVIDER_SITE_OTHER): Payer: Self-pay

## 2013-12-13 ENCOUNTER — Other Ambulatory Visit: Payer: Self-pay | Admitting: Internal Medicine

## 2013-12-13 ENCOUNTER — Ambulatory Visit
Admission: RE | Admit: 2013-12-13 | Discharge: 2013-12-13 | Disposition: A | Discharge status: Home / Self Care | Payer: BC Managed Care – PPO | Source: Ambulatory Visit | Attending: Internal Medicine | Admitting: Internal Medicine

## 2013-12-13 DIAGNOSIS — R05 Cough: Secondary | ICD-10-CM

## 2013-12-13 DIAGNOSIS — R059 Cough, unspecified: Secondary | ICD-10-CM

## 2015-04-21 IMAGING — CR DG CHEST 2V
2 series · 2 of 2 positions shown · non-contrast
Comparison: None.

CLINICAL DATA: Cough and difficulty breathing

EXAM:
CHEST  2 VIEW

[view not recorded (1 of 2)]
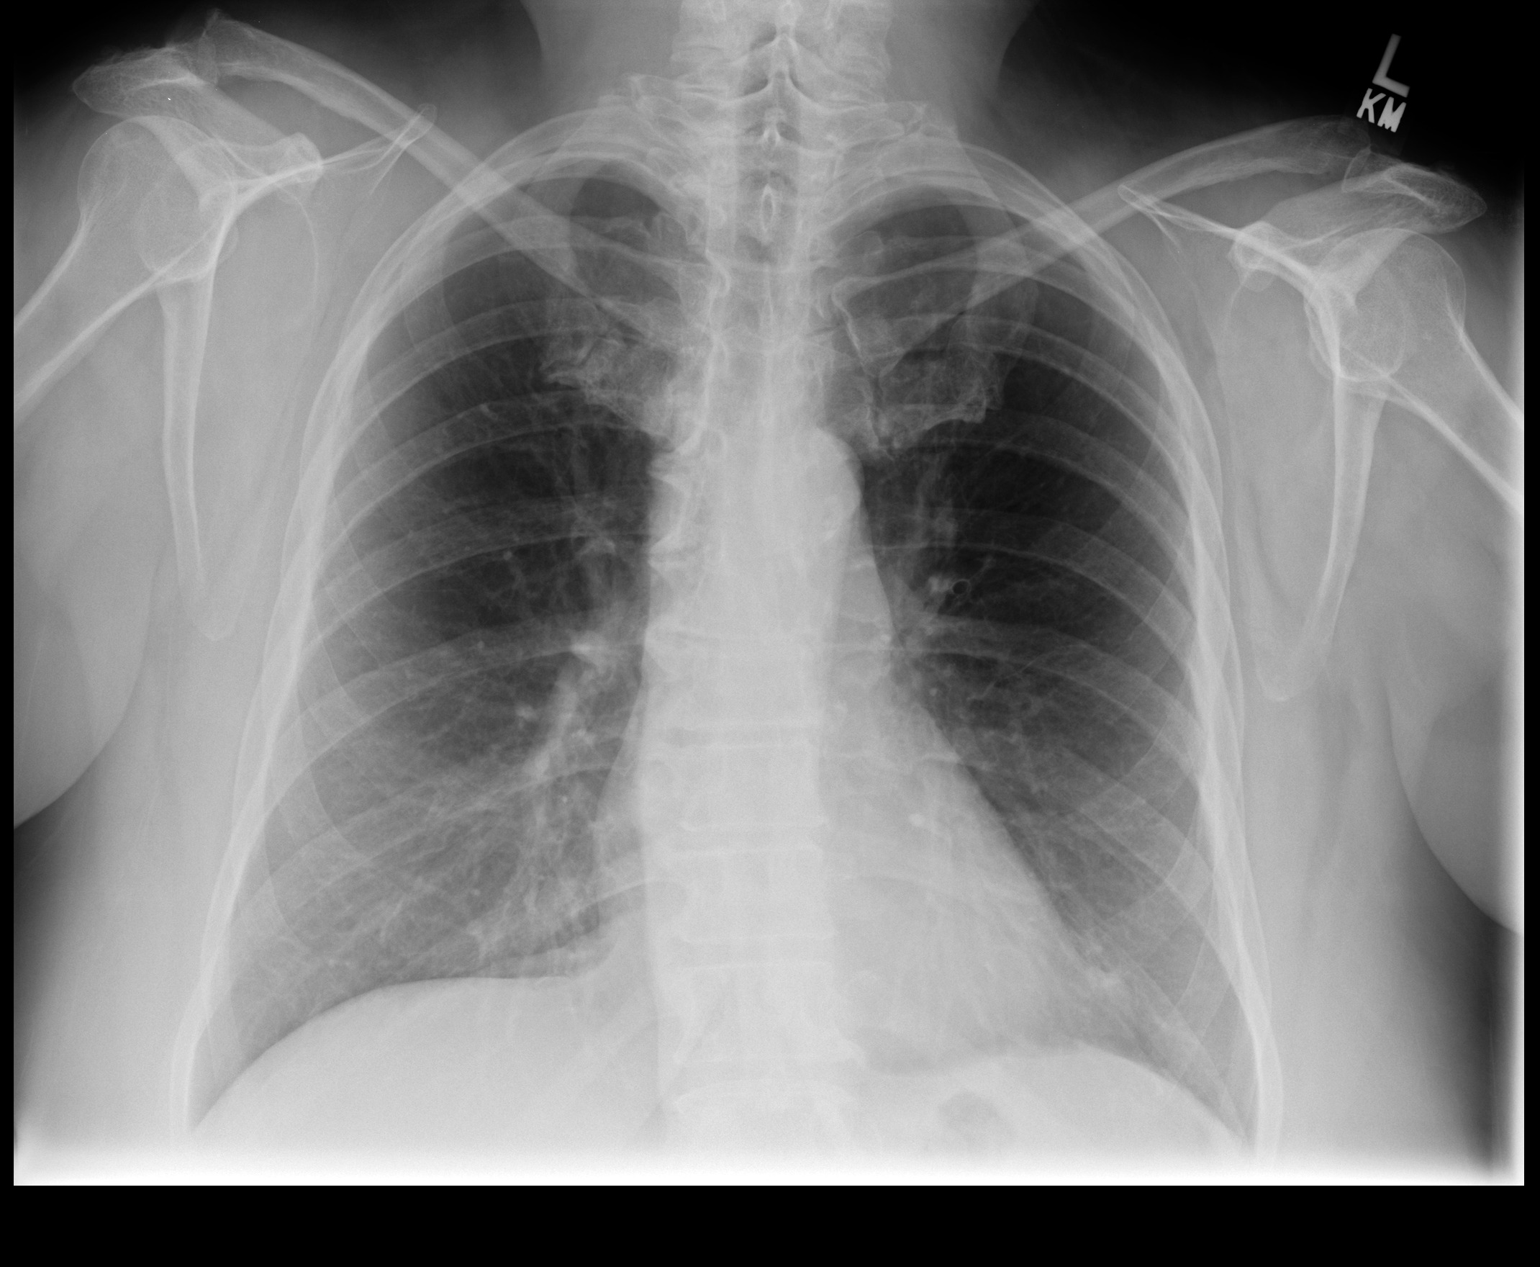

[view not recorded (2 of 2)]
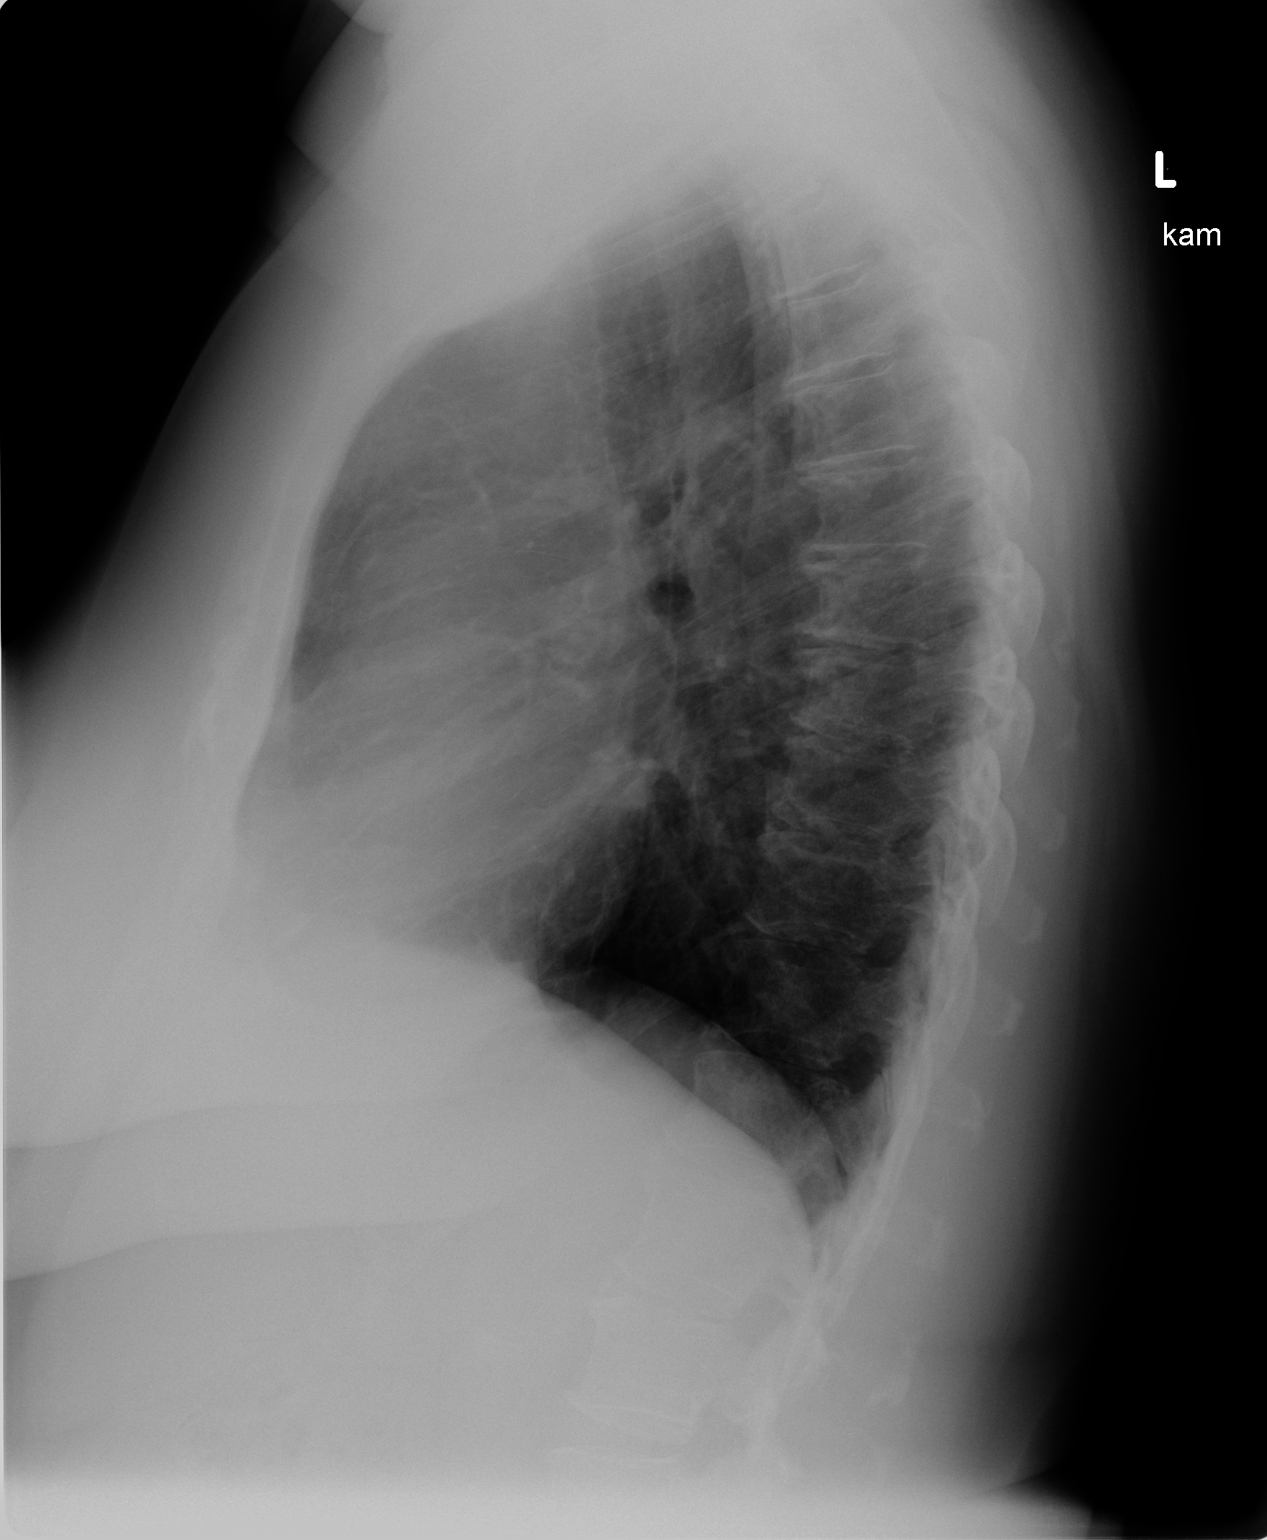

[2 of 2 positions shown; findings below may reference images not displayed]

FINDINGS: There is no edema or consolidation. The heart size and pulmonary
vascularity are normal. No adenopathy. There is degenerative change
in the thoracic spine.
IMPRESSION: No edema or consolidation.

## 2016-11-05 ENCOUNTER — Other Ambulatory Visit: Payer: Self-pay | Admitting: Internal Medicine

## 2016-11-05 DIAGNOSIS — Z1231 Encounter for screening mammogram for malignant neoplasm of breast: Secondary | ICD-10-CM

## 2016-11-05 DIAGNOSIS — Z780 Asymptomatic menopausal state: Secondary | ICD-10-CM

## 2016-11-06 ENCOUNTER — Ambulatory Visit (INDEPENDENT_AMBULATORY_CARE_PROVIDER_SITE_OTHER): Payer: BLUE CROSS/BLUE SHIELD

## 2016-11-06 ENCOUNTER — Ambulatory Visit: Payer: BLUE CROSS/BLUE SHIELD

## 2016-11-06 DIAGNOSIS — Z780 Asymptomatic menopausal state: Secondary | ICD-10-CM | POA: Diagnosis not present
